# Patient Record
Sex: Male | Born: 1975 | Race: White | Hispanic: No | Marital: Married | Smoking: Never smoker
Health system: Southern US, Community
[De-identification: ages and names within clinical notes are randomized; demographics above are authoritative.]

## PROBLEM LIST (undated history)

## (undated) DIAGNOSIS — R0602 Shortness of breath: Secondary | ICD-10-CM

## (undated) DIAGNOSIS — R002 Palpitations: Secondary | ICD-10-CM

## (undated) HISTORY — DX: Shortness of breath: R06.02

## (undated) HISTORY — DX: Palpitations: R00.2

## (undated) HISTORY — PX: VASECTOMY: SHX75

---

## 2020-06-30 ENCOUNTER — Encounter: Payer: Self-pay | Admitting: Family Medicine

## 2020-06-30 ENCOUNTER — Ambulatory Visit (INDEPENDENT_AMBULATORY_CARE_PROVIDER_SITE_OTHER): Payer: Managed Care, Other (non HMO) | Admitting: Family Medicine

## 2020-06-30 ENCOUNTER — Other Ambulatory Visit: Payer: Self-pay

## 2020-06-30 VITALS — BP 126/86 | HR 80 | Ht 74.0 in | Wt 172.4 lb

## 2020-06-30 DIAGNOSIS — Z7689 Persons encountering health services in other specified circumstances: Secondary | ICD-10-CM

## 2020-06-30 NOTE — Progress Notes (Signed)
Office Visit Note   Patient: Carlos Watts           Date of Birth: 04/07/1976           MRN: [id number] Visit Date: 06/30/2020 Requested by: No referring provider defined for this encounter. PCP: No primary care provider on file.  Subjective: Chief Complaint  Patient presents with  . establish primary care    HPI: He is here to establish care.  He and his wife recently moved here from [place].  He is from [place], but his wife is from [facility] and since he is now able to work from home, he was able to transfer here.  They have an 71-year-old daughter who attends [employer] day school.  Patient has been in very good health.  He had a physical in June before he moved and labs were essentially normal per his report.  He is up-to-date with eye exams and dental exams.  Family history was reviewed in detail today.                ROS:   He has occasional acid reflux which is generally manageable with lifestyle modifications.  All other systems were reviewed and are negative.  Objective: Vital Signs: BP 126/86   Pulse 80   Ht 6\' 2"  (1.88 m)   Wt 172 lb 6.4 oz (78.2 kg)   BMI 22.13 kg/m   Physical Exam:  General:  Alert and oriented, in no acute distress. Pulm:  Breathing unlabored. Psy:  Normal mood, congruent affect. Skin: No suspicious lesions seen. HEENT:  [place]/AT, PERRLA, EOM Full, no nystagmus.  Funduscopic examination within normal limits.  No conjunctival erythema.  Tympanic membranes are pearly gray with normal landmarks.  External ear canals are normal.  Nasal passages are clear.  No significant lymphadenopathy.  No thyromegaly or nodules.  2+ carotid pulses without bruits. CV: Regular rate and rhythm without murmurs, rubs, or gallops.  No peripheral edema.  2+ radial and posterior tibial pulses. Lungs: Clear to auscultation throughout with no wheezing or areas of consolidation. Abd: Bowel sounds are active, no hepatosplenomegaly or masses.  Soft and nontender.  No audible  bruits.  No evidence of ascites.    Imaging: No results found.  Assessment & Plan: 1.  Visit to establish care -Information given regarding MyChart. -He will follow-up next summer for his wellness examination and labs.  Contact me sooner for any issues.     Procedures: No procedures performed  No notes on file     PMFS History: There are no problems to display for this patient.  No past medical history on file.  Family History  Problem Relation Age of Onset  . Hypertension Mother   . CAD Mother        Carotid stenosis  . Cancer Father   . Prostate cancer Father   . Healthy Brother   . Hypertension Maternal Aunt   . Hypertension Maternal Uncle   . Ulcers Maternal Grandfather   . Heart failure Paternal Grandmother   . Lung cancer Paternal Grandfather        Smoker  . Cancer Paternal Grandfather   . Diabetes Neg Hx   . Colon cancer Neg Hx   . Melanoma Neg Hx      Social History   Occupational History  . Not on file  Tobacco Use  . Smoking status: Never Smoker  . Smokeless tobacco: Never Used  Substance and Sexual Activity  . Alcohol use: Yes  Alcohol/week: 2.0 - 3.0 standard drinks    Types: 2 - 3 Glasses of wine per week  . Drug use: Not on file  . Sexual activity: Not on file

## 2020-12-02 ENCOUNTER — Telehealth: Payer: Self-pay

## 2020-12-02 NOTE — Telephone Encounter (Signed)
Per Dr. Prince Rome' request, called to get clarification on the patient's appointment for tomorrow. If he is needing a wellness check/physical, then he was not given enough time for this. Asked him to call me back with the reason for his visit.

## 2020-12-03 ENCOUNTER — Other Ambulatory Visit: Payer: Self-pay

## 2020-12-03 ENCOUNTER — Encounter: Payer: Self-pay | Admitting: Family Medicine

## 2020-12-03 ENCOUNTER — Ambulatory Visit (INDEPENDENT_AMBULATORY_CARE_PROVIDER_SITE_OTHER): Payer: Managed Care, Other (non HMO) | Admitting: Family Medicine

## 2020-12-03 VITALS — BP 131/83 | HR 71

## 2020-12-03 DIAGNOSIS — R0789 Other chest pain: Secondary | ICD-10-CM

## 2020-12-03 DIAGNOSIS — R002 Palpitations: Secondary | ICD-10-CM | POA: Diagnosis not present

## 2020-12-03 NOTE — Progress Notes (Signed)
Office Visit Note   Patient: Carlos Watts           Date of Birth: 1976/01/17           MRN: [id number] Visit Date: 12/03/2020 Requested by: Lavada Mesi, MD [address] [place],  [place] [address] PCP: Lavada Mesi, MD  Subjective: Chief Complaint  Patient presents with  . Other    He had been traveling for a few weeks ([place] and then [place]).  He had what he thought was a cardiac event - went to the hospital. He said they ruled out cardiac with blood work and an EKG - was told it was a likely panic attack. Has had one other incident since.     HPI: He is here with chest pain and palpitations.  A few weeks ago he went to [place] then to [place].  He had dinner with colleagues, then went to bed and woke up 4 hours later with diaphoresis and chest pain with palpitations.  He called EMS who did an EKG which was normal, but took him to the hospital as a precaution and he underwent work-up to rule out MI.  Symptoms improved and he was discharged home.  He had another episode but not as severe.  For the past couple days he has been feeling normal.  He has been under a lot of stress in the past several months.  There is a family history of hypertension on his mother side with a TIA, but no early heart disease.  Overall he is a healthy eater and does not smoke.                ROS:   All other systems were reviewed and are negative.  Objective: Vital Signs: BP 131/83   Pulse 71   Physical Exam:  General:  Alert and oriented, in no acute distress. Pulm:  Breathing unlabored. Psy:  Normal mood, congruent affect.  Neck: No carotid bruits, no thyromegaly or nodules. CV: Regular rate and rhythm without murmurs, rubs, or gallops.  No peripheral edema.  2+ radial and posterior tibial pulses. Lungs: Clear to auscultation throughout with no wheezing or areas of consolidation.    Imaging: No results found.  Assessment & Plan: 1.  Recent episode of chest pain with palpitations,  could be due to stress but cannot rule out cardiac arrhythmia -We will draw some additional labs today.  If normal, then he will monitor his symptoms and if he has any more episodes he will contact me and I will order event monitoring.     Procedures: No procedures performed        PMFS History: There are no problems to display for this patient.  History reviewed. No pertinent past medical history.  Family History  Problem Relation Age of Onset  . Hypertension Mother   . CAD Mother        Carotid stenosis  . Cancer Father   . Prostate cancer Father   . Healthy Brother   . Hypertension Maternal Aunt   . Hypertension Maternal Uncle   . Ulcers Maternal Grandfather   . Heart failure Paternal Grandmother   . Lung cancer Paternal Grandfather        Smoker  . Cancer Paternal Grandfather   . Diabetes Neg Hx   . Colon cancer Neg Hx   . Melanoma Neg Hx     History reviewed. No pertinent surgical history. Social History   Occupational History  . Not on file  Tobacco Use  . Smoking status: Never Smoker  . Smokeless tobacco: Never Used  Substance and Sexual Activity  . Alcohol use: Yes    Alcohol/week: 2.0 - 3.0 standard drinks    Types: 2 - 3 Glasses of wine per week  . Drug use: Not on file  . Sexual activity: Not on file

## 2020-12-03 NOTE — Telephone Encounter (Signed)
The patient never returned my call. Appointment is still in place on Dr. Prince Rome' schedule today.

## 2020-12-04 ENCOUNTER — Telehealth: Payer: Self-pay | Admitting: Family Medicine

## 2020-12-04 LAB — CBC WITH DIFFERENTIAL/PLATELET
Absolute Monocytes: 423 cells/uL (ref 200–950)
Basophils Absolute: 20 cells/uL (ref 0–200)
Basophils Relative: 0.4 %
Eosinophils Absolute: 71 cells/uL (ref 15–500)
Eosinophils Relative: 1.4 %
HCT: 48.8 % (ref 38.5–50.0)
Hemoglobin: 16 g/dL (ref 13.2–17.1)
Lymphs Abs: 1785 cells/uL (ref 850–3900)
MCH: 28.4 pg (ref 27.0–33.0)
MCHC: 32.8 g/dL (ref 32.0–36.0)
MCV: 86.5 fL (ref 80.0–100.0)
MPV: 11.1 fL (ref 7.5–12.5)
Monocytes Relative: 8.3 %
Neutro Abs: 2800 cells/uL (ref 1500–7800)
Neutrophils Relative %: 54.9 %
Platelets: 249 10*3/uL (ref 140–400)
RBC: 5.64 10*6/uL (ref 4.20–5.80)
RDW: 13 % (ref 11.0–15.0)
Total Lymphocyte: 35 %
WBC: 5.1 10*3/uL (ref 3.8–10.8)

## 2020-12-04 LAB — COMPREHENSIVE METABOLIC PANEL
AG Ratio: 1.9 (calc) (ref 1.0–2.5)
ALT: 21 U/L (ref 9–46)
AST: 18 U/L (ref 10–40)
Albumin: 5 g/dL (ref 3.6–5.1)
Alkaline phosphatase (APISO): 55 U/L (ref 36–130)
BUN: 22 mg/dL (ref 7–25)
CO2: 26 mmol/L (ref 20–32)
Calcium: 10 mg/dL (ref 8.6–10.3)
Chloride: 101 mmol/L (ref 98–110)
Creat: 1.06 mg/dL (ref 0.60–1.35)
Globulin: 2.6 g/dL (calc) (ref 1.9–3.7)
Glucose, Bld: 93 mg/dL (ref 65–99)
Potassium: 4.8 mmol/L (ref 3.5–5.3)
Sodium: 138 mmol/L (ref 135–146)
Total Bilirubin: 0.9 mg/dL (ref 0.2–1.2)
Total Protein: 7.6 g/dL (ref 6.1–8.1)

## 2020-12-04 LAB — THYROID PANEL WITH TSH
Free Thyroxine Index: 2.6 (ref 1.4–3.8)
T3 Uptake: 30 % (ref 22–35)
T4, Total: 8.5 ug/dL (ref 4.9–10.5)
TSH: 2.01 mIU/L (ref 0.40–4.50)

## 2020-12-04 LAB — FERRITIN: Ferritin: 254 ng/mL (ref 38–380)

## 2020-12-04 LAB — VITAMIN D 25 HYDROXY (VIT D DEFICIENCY, FRACTURES): Vit D, 25-Hydroxy: 39 ng/mL (ref 30–100)

## 2020-12-04 NOTE — Telephone Encounter (Signed)
I called the patient and advised him of the results. Mailing a copy to him.

## 2020-12-04 NOTE — Telephone Encounter (Signed)
Labs look normal.  Can mail a copy if he'd like.

## 2021-04-02 ENCOUNTER — Ambulatory Visit (INDEPENDENT_AMBULATORY_CARE_PROVIDER_SITE_OTHER): Payer: Managed Care, Other (non HMO) | Admitting: Family Medicine

## 2021-04-02 ENCOUNTER — Other Ambulatory Visit: Payer: Self-pay

## 2021-04-02 ENCOUNTER — Ambulatory Visit (INDEPENDENT_AMBULATORY_CARE_PROVIDER_SITE_OTHER): Payer: Managed Care, Other (non HMO)

## 2021-04-02 ENCOUNTER — Encounter: Payer: Self-pay | Admitting: Family Medicine

## 2021-04-02 VITALS — BP 143/90 | HR 101 | Ht 74.0 in | Wt 169.8 lb

## 2021-04-02 DIAGNOSIS — R030 Elevated blood-pressure reading, without diagnosis of hypertension: Secondary | ICD-10-CM | POA: Diagnosis not present

## 2021-04-02 DIAGNOSIS — R002 Palpitations: Secondary | ICD-10-CM

## 2021-04-02 DIAGNOSIS — R0602 Shortness of breath: Secondary | ICD-10-CM | POA: Diagnosis not present

## 2021-04-02 MED ORDER — METOPROLOL TARTRATE 25 MG PO TABS: 25.0000 mg | ORAL_TABLET | Freq: Two times a day (BID) | ORAL | 3 refills | Status: AC | PRN

## 2021-04-02 NOTE — Progress Notes (Signed)
Office Visit Note   Patient: Carlos Watts           Date of Birth: [birth year]           MRN: [id number] Visit Date: 04/02/2021 Requested by: Lavada Mesi, MD [address] [place],  [place] [address] PCP: Lavada Mesi, MD  Subjective: Chief Complaint  Patient presents with   Other    Intermittent episodes of elevated heart rate and blood pressure, with feeling sluggish    HPI: He is here with recent issues with blood pressure elevation and palpitations.  He was on a 3-week vacation and 1 day while walking in the heat, shortly after getting started, he felt short of breath.  This is very unusual for him.  He decided to go home and he checked his blood pressure which was elevated.  He has had consistently elevated blood pressure and pulse.  No chest pain, no short of breath at rest.  Denies nausea or diaphoresis.  I saw him in March for similar issues with chest pain and palpitations.  Labs at that time were negative.  In between he has done pretty well.  These issues first started after getting his COVID-vaccine.  Then last winter after having COVID illness, it seems to have become more prominent.                ROS:   All other systems were reviewed and are negative.  Objective: Vital Signs: BP (!) 143/90 (BP Location: Left Arm, Patient Position: Sitting, Cuff Size: Normal)   Pulse (!) 101   Ht 6\' 2"  (1.88 m)   Wt 169 lb 12.8 oz (77 kg)   BMI 21.80 kg/m   Physical Exam:  General:  Alert and oriented, in no acute distress. Pulm:  Breathing unlabored. Psy:  Normal mood, congruent affect.  Neck: No carotid bruits, no thyromegaly or nodules, no lymphadenopathy. CV: Regular rate and rhythm without murmurs, rubs, or gallops.  There is an occasional pause.  No peripheral edema.  2+ radial and posterior tibial pulses. Lungs: Clear to auscultation throughout with no wheezing or areas of consolidation.    Imaging: No results found.  Assessment & Plan: Palpitations with blood  pressure elevation -I will order cardiac event monitoring, echocardiogram, and cardiology referral. -Prescription for metoprolol to take mainly on an as-needed basis for elevated blood pressure and pulse.     Procedures: No procedures performed        PMFS History: There are no problems to display for this patient.  History reviewed. No pertinent past medical history.  Family History  Problem Relation Age of Onset   Hypertension Mother    CAD Mother        Carotid stenosis   Cancer Father    Prostate cancer Father    Healthy Brother    Hypertension Maternal Aunt    Hypertension Maternal Uncle    Ulcers Maternal Grandfather    Heart failure Paternal Grandmother    Lung cancer Paternal Grandfather        Smoker   Cancer Paternal Grandfather    Diabetes Neg Hx    Colon cancer Neg Hx    Melanoma Neg Hx     History reviewed. No pertinent surgical history. Social History   Occupational History   Not on file  Tobacco Use   Smoking status: Never   Smokeless tobacco: Never  Substance and Sexual Activity   Alcohol use: Yes    Alcohol/week: 2.0 - 3.0 standard drinks  Types: 2 - 3 Glasses of wine per week   Drug use: Not on file   Sexual activity: Not on file

## 2021-04-02 NOTE — Progress Notes (Unsigned)
Patient enrolled for [employer] to mail a 14 day ZIO XT monitor to his home. 

## 2021-04-04 DIAGNOSIS — R002 Palpitations: Secondary | ICD-10-CM | POA: Diagnosis not present

## 2021-04-04 DIAGNOSIS — R0602 Shortness of breath: Secondary | ICD-10-CM

## 2021-04-04 DIAGNOSIS — R030 Elevated blood-pressure reading, without diagnosis of hypertension: Secondary | ICD-10-CM

## 2021-04-08 ENCOUNTER — Encounter: Payer: Self-pay | Admitting: Family Medicine

## 2021-04-22 ENCOUNTER — Encounter: Payer: Self-pay | Admitting: Family Medicine

## 2021-04-22 DIAGNOSIS — R030 Elevated blood-pressure reading, without diagnosis of hypertension: Secondary | ICD-10-CM

## 2021-04-22 DIAGNOSIS — R002 Palpitations: Secondary | ICD-10-CM

## 2021-04-24 ENCOUNTER — Telehealth: Payer: Self-pay | Admitting: Family Medicine

## 2021-04-24 ENCOUNTER — Ambulatory Visit (HOSPITAL_COMMUNITY)
Admission: RE | Admit: 2021-04-24 | Discharge: 2021-04-24 | Disposition: A | Discharge status: Home / Self Care | Payer: Self-pay | Source: Ambulatory Visit | Attending: Family Medicine | Admitting: Family Medicine

## 2021-04-24 ENCOUNTER — Other Ambulatory Visit: Payer: Self-pay

## 2021-04-24 DIAGNOSIS — K449 Diaphragmatic hernia without obstruction or gangrene: Secondary | ICD-10-CM | POA: Insufficient documentation

## 2021-04-24 DIAGNOSIS — Z136 Encounter for screening for cardiovascular disorders: Secondary | ICD-10-CM | POA: Insufficient documentation

## 2021-04-24 IMAGING — CT CT CARDIAC CORONARY ARTERY CALCIUM SCORE
3 series · 14 of 20 positions shown, 15 images · non-contrast
Comparison: None.

Addendum:
CLINICAL DATA: Cardiovascular Disease Risk stratification

EXAM:
Coronary Calcium Score
TECHNIQUE: A gated, non-contrast computed tomography scan of the heart was
performed using 3mm slice thickness. Axial images were analyzed on a
dedicated workstation. Calcium scoring of the coronary arteries was
performed using the Agatston method.

[Series 3: ax ca scr 70% (id) · axial · 0.39mm/px · z∈[+1027,+1163]mm · 6 of 96 slices shown]
[im 14/96  vessel]
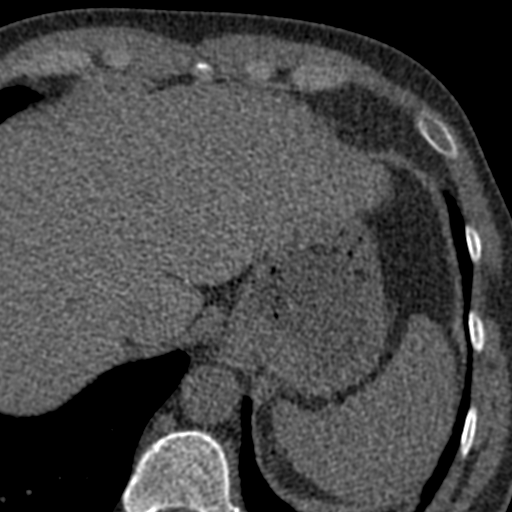
[im 28/96  vessel]
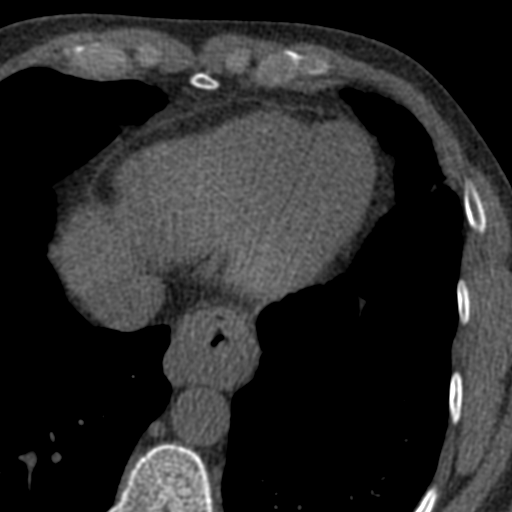
[im 41/96  vessel]
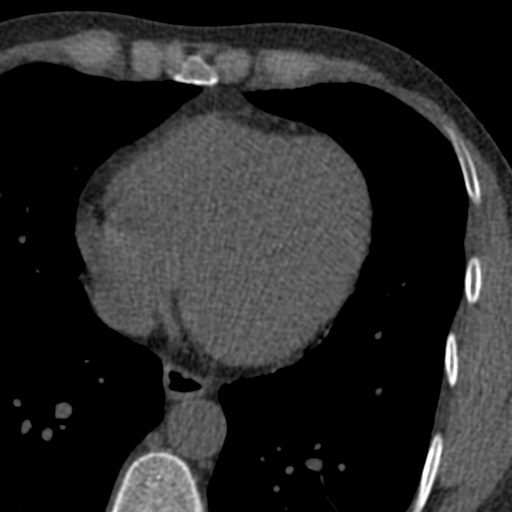
[im 55/96  vessel]
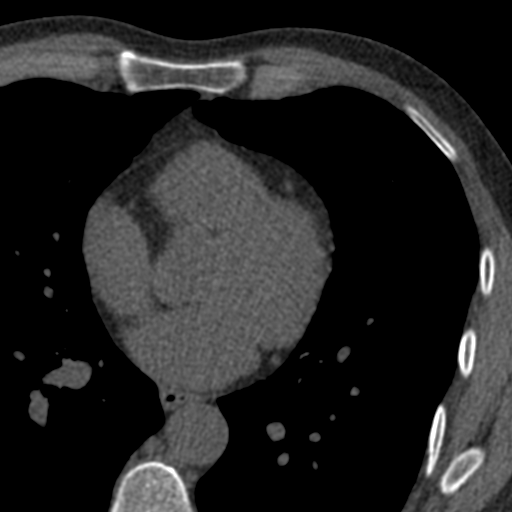
[im 68/96  vessel]
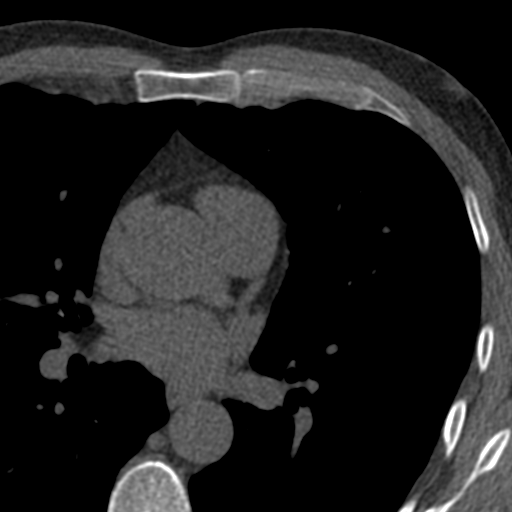
[im 82/96  vessel]
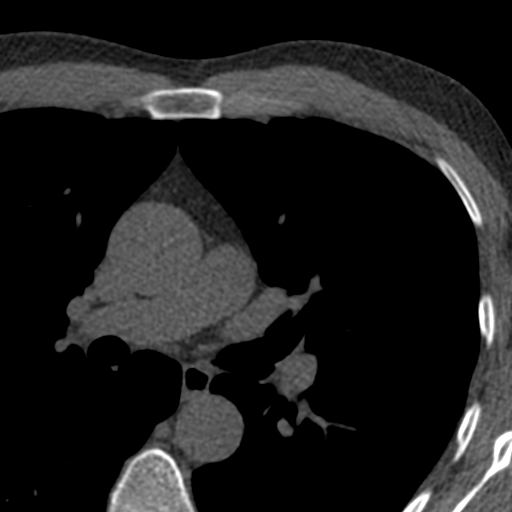

[Series 4: ax st · axial · 0.69mm/px · z∈[+1038,+1152]mm · 4 of 64 slices shown, 5 images]
[im 13/64  vessel]
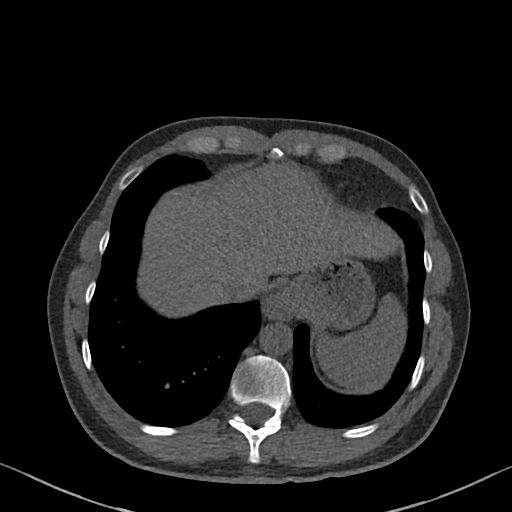
[im 13/64  lung]
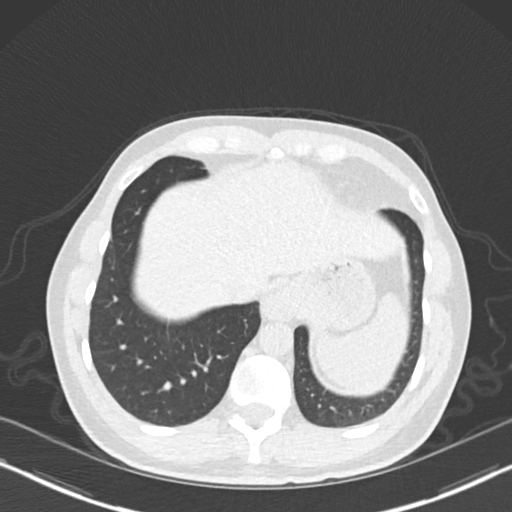
[im 26/64  vessel]
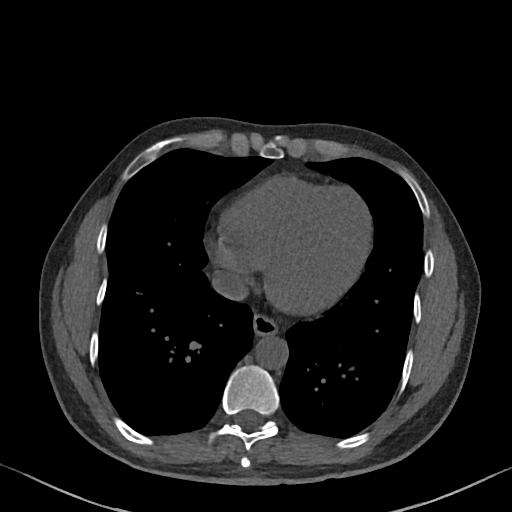
[im 38/64  vessel]
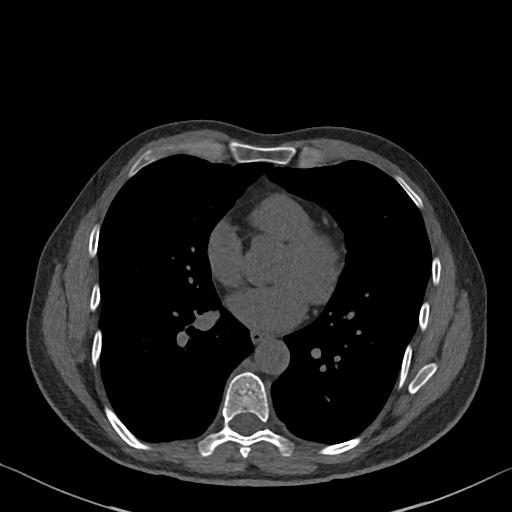
[im 51/64  vessel]
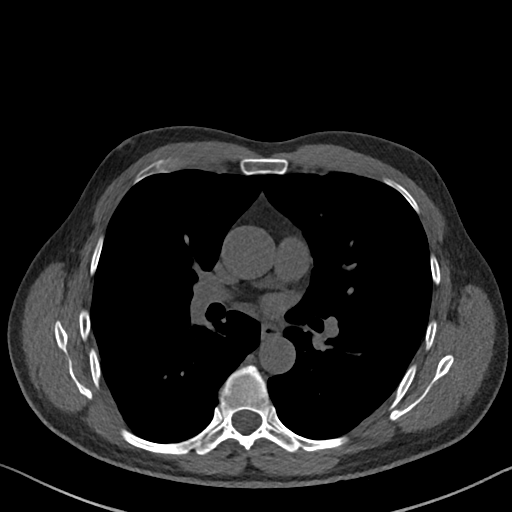

[Series 5: ax lung · axial · 0.57mm/px · z∈[+1038,+1152]mm · 4 of 64 slices shown]
[im 13/64  lung]
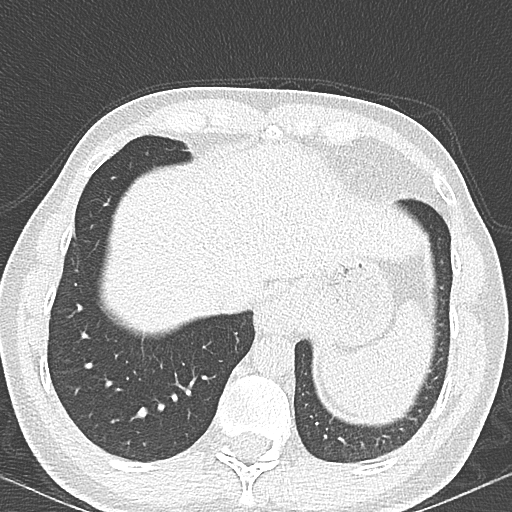
[im 26/64  lung]
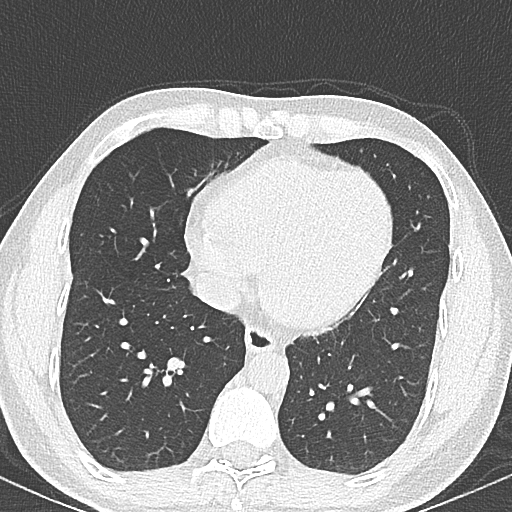
[im 38/64  lung]
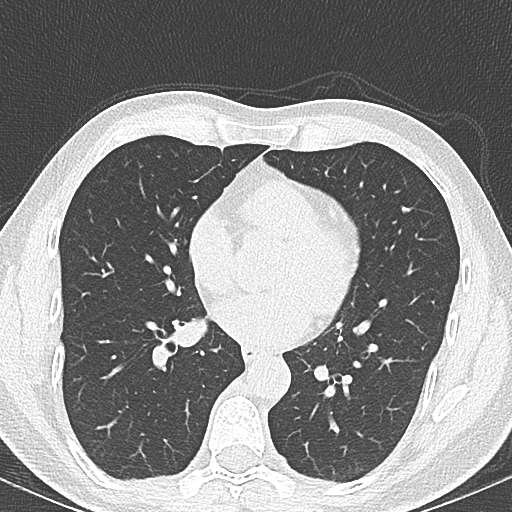
[im 51/64  lung]
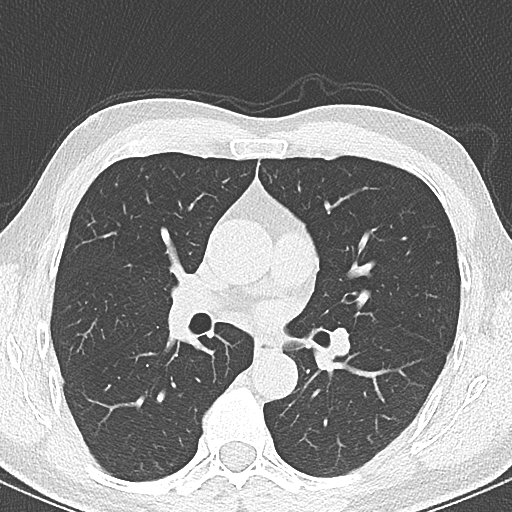

[14 of 20 positions shown; findings below may reference images not displayed]

FINDINGS: Coronary arteries: Normal origins.

Coronary Calcium Score:

Left main: 0

Left anterior descending artery: 0

Left circumflex artery: 0

Right coronary artery: 0

Total: 0

Percentile:

Pericardium: Normal.

Ascending Aorta: Normal caliber.

Non-cardiac: See separate report from [REDACTED].
IMPRESSION: Coronary calcium score of 0.



If CAC=0, it is reasonable to withhold statin therapy and reassess
in 5 to 10 years, as long as higher risk conditions are absent
(diabetes mellitus, family history of premature CHD in first degree
relatives (males <55 years; females <65 years), cigarette smoking,
or LDL >=190 mg/dL).

If CAC is 1 to 99, it is reasonable to initiate statin therapy for
patients >=55 years of age.

If CAC is >=100 or >=75th percentile, it is reasonable to initiate
statin therapy at any age.

Cardiology referral should be considered for patients with CAC
scores >=400 or >=75th percentile.

*[76] AHA/ACC/AACVPR/AAPA/ABC/ELSA MARGARIDA/ELSA MARGARIDA/ELSA MARGARIDA/ELSA MARGARIDA/ELSA MARGARIDA/ELSA MARGARIDA/ELSA MARGARIDA
Guideline on the Management of Blood Cholesterol: A Report of the
American College of Cardiology/American Heart Association Task Force
on Clinical Practice Guidelines. J Am Coll Cardiol.
[76];73(24):[PHONE_NUMBER].

EXAM:
OVER-READ INTERPRETATION  CT CHEST

The following report is an over-read performed by radiologist Dr.
ELSA MARGARIDA [REDACTED] on [DATE]. This over-read
does not include interpretation of cardiac or coronary anatomy or
pathology. The calcium score interpretation by the cardiologist is
attached.
FINDINGS: Vascular: Normal aortic caliber.

Mediastinum/Nodes: No imaged thoracic adenopathy. Small hiatal
hernia.

Lungs/Pleura: No pleural fluid.  Clear imaged lungs.

Upper Abdomen: Normal imaged portions of the liver, spleen, adrenal
glands, left kidney.

Musculoskeletal: Convex right thoracic spine curvature is moderate.
IMPRESSION: 1.  No acute findings in the imaged extracardiac chest.
2. Small hiatal hernia.

*** End of Addendum ***
FINDINGS: Coronary arteries: Normal origins.

Coronary Calcium Score:

Left main: 0

Left anterior descending artery: 0

Left circumflex artery: 0

Right coronary artery: 0

Total: 0

Percentile:

Pericardium: Normal.

Ascending Aorta: Normal caliber.

Non-cardiac: See separate report from [REDACTED].
IMPRESSION: Coronary calcium score of 0.



If CAC=0, it is reasonable to withhold statin therapy and reassess
in 5 to 10 years, as long as higher risk conditions are absent
(diabetes mellitus, family history of premature CHD in first degree
relatives (males <55 years; females <65 years), cigarette smoking,
or LDL >=190 mg/dL).

If CAC is 1 to 99, it is reasonable to initiate statin therapy for
patients >=55 years of age.

If CAC is >=100 or >=75th percentile, it is reasonable to initiate
statin therapy at any age.

Cardiology referral should be considered for patients with CAC
scores >=400 or >=75th percentile.

*[76] AHA/ACC/AACVPR/AAPA/ABC/ELSA MARGARIDA/ELSA MARGARIDA/ELSA MARGARIDA/ELSA MARGARIDA/ELSA MARGARIDA/ELSA MARGARIDA/ELSA MARGARIDA
Guideline on the Management of Blood Cholesterol: A Report of the
American College of Cardiology/American Heart Association Task Force
on Clinical Practice Guidelines. J Am Coll Cardiol.
[76];73(24):[PHONE_NUMBER].

## 2021-04-24 NOTE — Telephone Encounter (Signed)
Monitoring results were unremarkable.

## 2021-04-27 ENCOUNTER — Other Ambulatory Visit: Payer: Self-pay

## 2021-04-27 ENCOUNTER — Telehealth: Payer: Self-pay | Admitting: Family Medicine

## 2021-04-27 ENCOUNTER — Ambulatory Visit (HOSPITAL_COMMUNITY): Payer: Managed Care, Other (non HMO) | Attending: Internal Medicine

## 2021-04-27 DIAGNOSIS — R002 Palpitations: Secondary | ICD-10-CM | POA: Diagnosis not present

## 2021-04-27 DIAGNOSIS — R030 Elevated blood-pressure reading, without diagnosis of hypertension: Secondary | ICD-10-CM | POA: Insufficient documentation

## 2021-04-27 DIAGNOSIS — R0602 Shortness of breath: Secondary | ICD-10-CM | POA: Insufficient documentation

## 2021-04-27 LAB — ECHOCARDIOGRAM COMPLETE
Area-P 1/2: 3.05 cm2
S' Lateral: 2.8 cm

## 2021-04-27 NOTE — Telephone Encounter (Signed)
Calcium score is zero.

## 2021-04-28 ENCOUNTER — Telehealth: Payer: Self-pay | Admitting: Family Medicine

## 2021-04-28 NOTE — Telephone Encounter (Signed)
Echocardiogram looks good.

## 2021-07-06 ENCOUNTER — Other Ambulatory Visit: Payer: Self-pay

## 2021-07-06 ENCOUNTER — Encounter: Payer: Self-pay | Admitting: Cardiovascular Disease

## 2021-07-06 ENCOUNTER — Ambulatory Visit (INDEPENDENT_AMBULATORY_CARE_PROVIDER_SITE_OTHER): Payer: Managed Care, Other (non HMO) | Admitting: Cardiovascular Disease

## 2021-07-06 VITALS — BP 126/78 | HR 73 | Ht 74.0 in | Wt 166.8 lb

## 2021-07-06 DIAGNOSIS — R002 Palpitations: Secondary | ICD-10-CM | POA: Diagnosis not present

## 2021-07-06 NOTE — Progress Notes (Signed)
Chief Complaint  Patient presents with   New Patient (Initial Visit)    Palpitations     History of Present Illness: 45 yo male with history of palpitations who is here today as a new referral by Dr. Prince Rome for the evaluation of palpitations. He was on a business trip in march 2022 and felt his heart racing and was dyspneic. Taken to ED there and workup normal. Recurrent palpitations summer of 2022. Echo 04/27/21 with LVEF=60-65%. No valve issues. Cardiac calcium score of zero in Auigust 2022. Cardiac monitor in July 2022 with sinus, rare PACs, rare PVCs.   He tells me today that he feels back to normal. He denies chest pain or dyspnea. Rare skipped heart beats. He has some back pain. He is very active. He and his wife moved here from [place] this summer. His wife is also my patient. Their 68 yo daughter is at [facility] [employer].   Primary Care Physician: Lavada Mesi, MD   Past Medical History:  Diagnosis Date   Palpitations    SOB (shortness of breath)     Past Surgical History:  Procedure Laterality Date   VASECTOMY      Current Outpatient Medications  Medication Sig Dispense Refill   Cholecalciferol (VITAMIN D3) 125 MCG (5000 UT) TABS Take by mouth daily.     Cobalamin Combinations (VITAMIN B12-FOLIC ACID) 500-400 MCG TABS Take by mouth.     Coenzyme Q10 (COQ10 PO) Take 120 mg by mouth daily.     magnesium gluconate (MAGONATE) 500 MG tablet Take 500 mg by mouth daily.     metoprolol tartrate (LOPRESSOR) 25 MG tablet Take 1 tablet (25 mg total) by mouth 2 (two) times daily as needed. 60 tablet 3   Probiotic Product (PROBIOTIC DAILY PO) Take by mouth daily.     No current facility-administered medications for this visit.    Allergies  Allergen Reactions   Penicillins Hives    Social History   Socioeconomic History   Marital status: Married    Spouse name: Not on file   Number of children: Not on file   Years of education: Not on file   Highest education level: Not on file   Occupational History   Not on file  Tobacco Use   Smoking status: Never   Smokeless tobacco: Never  Substance and Sexual Activity   Alcohol use: Yes    Alcohol/week: 2.0 - 3.0 standard drinks    Types: 2 - 3 Glasses of wine per week   Drug use: Not on file   Sexual activity: Not on file  Other Topics Concern   Not on file  Social History Narrative   Not on file   Social Determinants of Health   Financial Resource Strain: Not on file  Food Insecurity: Not on file  Transportation Needs: Not on file  Physical Activity: Not on file  Stress: Not on file  Social Connections: Not on file  Intimate Partner Violence: Not on file    Family History  Problem Relation Age of Onset   Hypertension Mother    Cancer Father    Prostate cancer Father    Healthy Brother    Ulcers Maternal Grandfather    Heart failure Paternal Grandmother    Lung cancer Paternal Grandfather        Smoker   Cancer Paternal Grandfather    Hypertension Maternal Aunt    Hypertension Maternal Uncle    Diabetes Neg Hx    Colon cancer Neg Hx  Melanoma Neg Hx     Review of Systems:  As stated in the HPI and otherwise negative.   BP 126/78   Pulse 73   Ht 6\' 2"  (1.88 m)   Wt 166 lb 12.8 oz (75.7 kg)   SpO2 99%   BMI 21.42 kg/m   Physical Examination: General: Well developed, well nourished, NAD  HEENT: OP clear, mucus membranes moist  SKIN: warm, dry. No rashes. Neuro: No focal deficits  Musculoskeletal: Muscle strength 5/5 all ext  Psychiatric: Mood and affect normal  Neck: No JVD, no carotid bruits, no thyromegaly, no lymphadenopathy.  Lungs:Clear bilaterally, no wheezes, rhonci, crackles Cardiovascular: Regular rate and rhythm. No murmurs, gallops or rubs. Abdomen:Soft. Bowel sounds present. Non-tender.  Extremities: No lower extremity edema. Pulses are 2 + in the bilateral DP/PT.  EKG:  EKG is ordered today. The ekg ordered today demonstrates sinus  Echo 04/27/21:  1. Global  longitudinal strain is -21.1%. Left ventricular ejection  fraction, by estimation, is 60 to 65%. The left ventricle has normal  function. The left ventricle has no regional wall motion abnormalities.  There is mild left ventricular hypertrophy.  Left ventricular diastolic parameters were normal.   2. Right ventricular systolic function is normal. The right ventricular  size is normal.   3. The mitral valve is normal in structure. Trivial mitral valve  regurgitation.   4. The aortic valve is normal in structure. Aortic valve regurgitation is  not visualized.   5. The inferior vena cava is normal in size with greater than 50%  respiratory variability, suggesting right atrial pressure of 3 mmHg.   Recent Labs: 12/03/2020: ALT 21; BUN 22; Creat 1.06; Hemoglobin 16.0; Platelets 249; Potassium 4.8; Sodium 138; TSH 2.01   Lipid Panel No results found for: CHOL, TRIG, HDL, CHOLHDL, VLDL, LDLCALC, LDLDIRECT   Wt Readings from Last 3 Encounters:  07/06/21 166 lb 12.8 oz (75.7 kg)  04/02/21 169 lb 12.8 oz (77 kg)  06/30/20 172 lb 6.4 oz (78.2 kg)     Assessment and Plan:   1. Palpitations/PACs: he has a normal echo and cardiac monitor showed PACs. His EKG today is normal. He has no risk factors for CAD. I do not think further cardiac workup is indicated. I will see him back as needed.   Current medicines are reviewed at length with the patient today.  The patient does not have concerns regarding medicines.  The following changes have been made:  no change  Labs/ tests ordered today include:   Orders Placed This Encounter  Procedures   EKG 12-Lead      Disposition:   F/U with me as needed.    Signed, Lauree Chandler, MD 07/06/2021 9:27 AM    [facility] [place], [place],   [address] Phone: [phone]; Fax: [phone]

## 2021-07-06 NOTE — Patient Instructions (Signed)
Medication Instructions:  No changes made today. *If you need a refill on your cardiac medications before your next appointment, please call your pharmacy*   Lab Work: none If you have labs (blood work) drawn today and your tests are completely normal, you will receive your results only by: MyChart Message (if you have MyChart) OR A paper copy in the mail If you have any lab test that is abnormal or we need to change your treatment, we will call you to review the results.   Testing/Procedures: none   Follow-Up: As needed.

## 2022-11-25 ENCOUNTER — Ambulatory Visit (HOSPITAL_COMMUNITY)
Admission: RE | Admit: 2022-11-25 | Discharge: 2022-11-25 | Disposition: A | Discharge status: Home / Self Care | Payer: Managed Care, Other (non HMO) | Source: Ambulatory Visit | Attending: Vascular Surgery | Admitting: Vascular Surgery

## 2022-11-25 ENCOUNTER — Other Ambulatory Visit (HOSPITAL_COMMUNITY): Payer: Self-pay | Admitting: Family Medicine

## 2022-11-25 DIAGNOSIS — R209 Unspecified disturbances of skin sensation: Secondary | ICD-10-CM | POA: Insufficient documentation
